# Patient Record
Sex: Male | Born: 1965 | Race: Black or African American | Hispanic: No | Marital: Married | Smoking: Never smoker
Health system: Southern US, Community
[De-identification: ages and names within clinical notes are randomized; demographics above are authoritative.]

---

## 2009-03-27 ENCOUNTER — Encounter: Admission: RE | Admit: 2009-03-27 | Discharge: 2009-03-27 | Payer: Self-pay | Admitting: Chiropractic Medicine

## 2011-07-22 ENCOUNTER — Emergency Department (INDEPENDENT_AMBULATORY_CARE_PROVIDER_SITE_OTHER)
Admission: EM | Admit: 2011-07-22 | Discharge: 2011-07-22 | Disposition: A | Discharge status: Home / Self Care | Payer: BC Managed Care – PPO | Source: Home / Self Care | Attending: Family Medicine | Admitting: Family Medicine

## 2011-07-22 ENCOUNTER — Emergency Department (INDEPENDENT_AMBULATORY_CARE_PROVIDER_SITE_OTHER): Payer: BC Managed Care – PPO

## 2011-07-22 ENCOUNTER — Encounter (HOSPITAL_COMMUNITY): Payer: Self-pay | Admitting: Emergency Medicine

## 2011-07-22 DIAGNOSIS — J029 Acute pharyngitis, unspecified: Secondary | ICD-10-CM

## 2011-07-22 MED ORDER — PREDNISONE (PAK) 10 MG PO TABS: ORAL_TABLET | ORAL | Status: AC

## 2011-07-22 MED ORDER — AZITHROMYCIN 250 MG PO TABS: 250.0000 mg | ORAL_TABLET | Freq: Every day | ORAL | Status: AC

## 2011-07-22 MED ORDER — HYDROCOD POLST-CHLORPHEN POLST 10-8 MG/5ML PO LQCR: 5.0000 mL | Freq: Two times a day (BID) | ORAL | Status: DC | PRN

## 2011-07-22 NOTE — Discharge Instructions (Signed)
Take steroids as directed. Use cough syrup as needed and as directed. If no improvement in symptoms over the next 48 to 72 hours, then fill the antibiotic prescription and complete full course. I recommend aggressive fever control with acetaminophen (Tylenol) and/or ibuprofen. You may use these together, alternating them every 4 hours, or individually, every 8 hours. For example, take acetaminophen 500 to 1000 mg at 12 noon, then 600 to 800 mg of ibuprofen at 4 pm, then acetaminophen at 8 pm, etc. Also, stay hydrated with clear liquids. Return to care should your symptoms not improve, or worsen in any way.

## 2011-07-22 NOTE — ED Notes (Signed)
Vomited twice Saturday, no vomiting since then and no diarrhea

## 2011-07-22 NOTE — ED Provider Notes (Signed)
History     CSN: [id number]  Arrival date & time 07/22/11  1438   First MD Initiated Contact with Patient 07/22/11 1502      Chief Complaint  Patient presents with  . Cough    (Consider location/radiation/quality/duration/timing/severity/associated sxs/prior treatment) HPI Comments: Brandon Wilkins presents for evaluation of persistent cough and sore throat since Saturday. He reports, that he was cleaning his bathroom, but did not having commercial products, available. He reports mixing. Several different chemicals together, to make his own. He reports some noxious fumes that he inhaled. Since that time. He has had persistent cough, and sore throat. He also reports chills.  Patient is a 46 y.o. male presenting with pharyngitis. The history is provided by the patient.  Sore Throat This is a new problem. The current episode started more than 2 days ago. The problem occurs constantly. The problem has not changed since onset.The symptoms are aggravated by swallowing, coughing and drinking. The symptoms are relieved by nothing. He has tried nothing for the symptoms.    History reviewed. No pertinent past medical history.  History reviewed. No pertinent past surgical history.  No family history on file.  History  Substance Use Topics  . Smoking status: Never Smoker   . Smokeless tobacco: Not on file  . Alcohol Use: Yes      Review of Systems  Constitutional: Positive for fever and chills.  HENT: Positive for sore throat. Negative for trouble swallowing.   Eyes: Negative.   Respiratory: Positive for cough.   Cardiovascular: Negative.   Gastrointestinal: Negative.   Genitourinary: Negative.   Musculoskeletal: Negative.   Skin: Negative.   Neurological: Negative.     Allergies  Review of patient's allergies indicates no known allergies.  Home Medications   Current Outpatient Rx  Name Route Sig Dispense Refill  . ALKA-SELTZER PLUS COLD PO Oral Take by mouth.    . NYQUIL PO Oral  Take by mouth.    . AZITHROMYCIN 250 MG PO TABS Oral Take 1 tablet (250 mg total) by mouth daily. Take two tablets on first day, then one tablet each day for four days 6 tablet 0  . HYDROCOD POLST-CPM POLST ER 10-8 MG/5ML PO LQCR Oral Take 5 mLs by mouth every 12 (twelve) hours as needed. 140 mL 0  . PREDNISONE (PAK) 10 MG PO TABS  Take 6 tablets on day 1, 5 tablets on day 2, 4 tablets on day 3, 3 tablets on day 4, 2 tablets on day 5, 1 tablet on day 6 21 tablet 0    BP 150/91  Pulse 94  Temp(Src) 100.8 F (38.2 C) (Oral)  Resp 20  SpO2 96%  Physical Exam  Nursing note and vitals reviewed. Constitutional: He is oriented to person, place, and time. He appears well-developed and well-nourished.  HENT:  Head: Normocephalic and atraumatic.  Right Ear: Tympanic membrane normal.  Left Ear: Tympanic membrane normal.  Mouth/Throat: Uvula is midline, oropharynx is clear and moist and mucous membranes are normal.  Eyes: EOM are normal.  Neck: Normal range of motion.  Cardiovascular: Normal rate, regular rhythm, S1 normal, S2 normal and normal heart sounds.   No murmur heard. Pulmonary/Chest: Effort normal. He has no decreased breath sounds. He has wheezes in the right middle field and the right lower field. He has no rhonchi.  Musculoskeletal: Normal range of motion.  Neurological: He is alert and oriented to person, place, and time.  Skin: Skin is warm and dry.  Psychiatric: His behavior is  normal.    ED Course  Procedures (including critical care time)  Labs Reviewed - No data to display Dg Chest 2 View  07/22/2011  *RADIOLOGY REPORT*  Clinical Data: Cough and fever.  CHEST - 2 VIEW  Comparison: None  Findings: The cardiac silhouette, mediastinal and hilar contours are within normal limits.  The lungs are clear.  No pleural effusion.  The bony thorax is intact.  IMPRESSION: No acute cardiopulmonary findings  Original Report Authenticated By: P. Loralie Champagne, M.D.     1. Pharyngitis        MDM  Xray reviewed by radiologist and myself; no pneumonia; rx given for prednisone, Tussionex, and azithromycin        Renaee Munda, MD 07/22/11 1819

## 2011-07-22 NOTE — ED Notes (Signed)
Reports cough and chills, onset Saturday evening.  Patient that cough was related to fumes from cleaning chemicals.  C/o sore throat.  Reports yellowish/white phlegm.

## 2018-05-27 ENCOUNTER — Ambulatory Visit
Admission: RE | Admit: 2018-05-27 | Discharge: 2018-05-27 | Disposition: A | Discharge status: Home / Self Care | Payer: BC Managed Care – PPO | Source: Ambulatory Visit | Attending: Internal Medicine | Admitting: Internal Medicine

## 2018-05-27 ENCOUNTER — Other Ambulatory Visit: Payer: Self-pay | Admitting: Internal Medicine

## 2018-05-27 DIAGNOSIS — R109 Unspecified abdominal pain: Secondary | ICD-10-CM

## 2020-06-13 ENCOUNTER — Ambulatory Visit: Payer: Self-pay | Attending: Internal Medicine

## 2020-06-13 ENCOUNTER — Other Ambulatory Visit: Payer: Self-pay

## 2020-06-13 DIAGNOSIS — Z23 Encounter for immunization: Secondary | ICD-10-CM

## 2020-06-13 NOTE — Progress Notes (Signed)
   Covid-19 Vaccination Clinic  Name:  Brandon Wilkins    MRN: [id number] DOB: Nov 09, 1965  06/13/2020  [name] was observed post Covid-19 immunization for 15 minutes without incident. He was provided with Vaccine Information Sheet and instruction to access the V-Safe system.   [name] was instructed to call 911 with any severe reactions post vaccine: Marland Kitchen Difficulty breathing  . Swelling of face and throat  . A fast heartbeat  . A bad rash all over body  . Dizziness and weakness   Immunizations Administered    Name Date Dose VIS Date Route   PFIZER Comrnaty(Gray TOP) Covid-19 Vaccine 06/13/2020  2:42 PM 0.3 mL 03/29/2020 Intramuscular   Manufacturer: [employer], [employer]   Lot: [id number]   NDC: [phone]

## 2020-06-20 ENCOUNTER — Other Ambulatory Visit: Payer: Self-pay

## 2020-06-22 ENCOUNTER — Other Ambulatory Visit: Payer: BC Managed Care – PPO

## 2020-08-04 IMAGING — CR DG ABDOMEN 2V
2 series · 2 of 2 positions shown · non-contrast
Comparison: None.

CLINICAL DATA: Generalized abdominal pain for 3 weeks.

EXAM:
ABDOMEN - 2 VIEW

[w abdomen upright *]
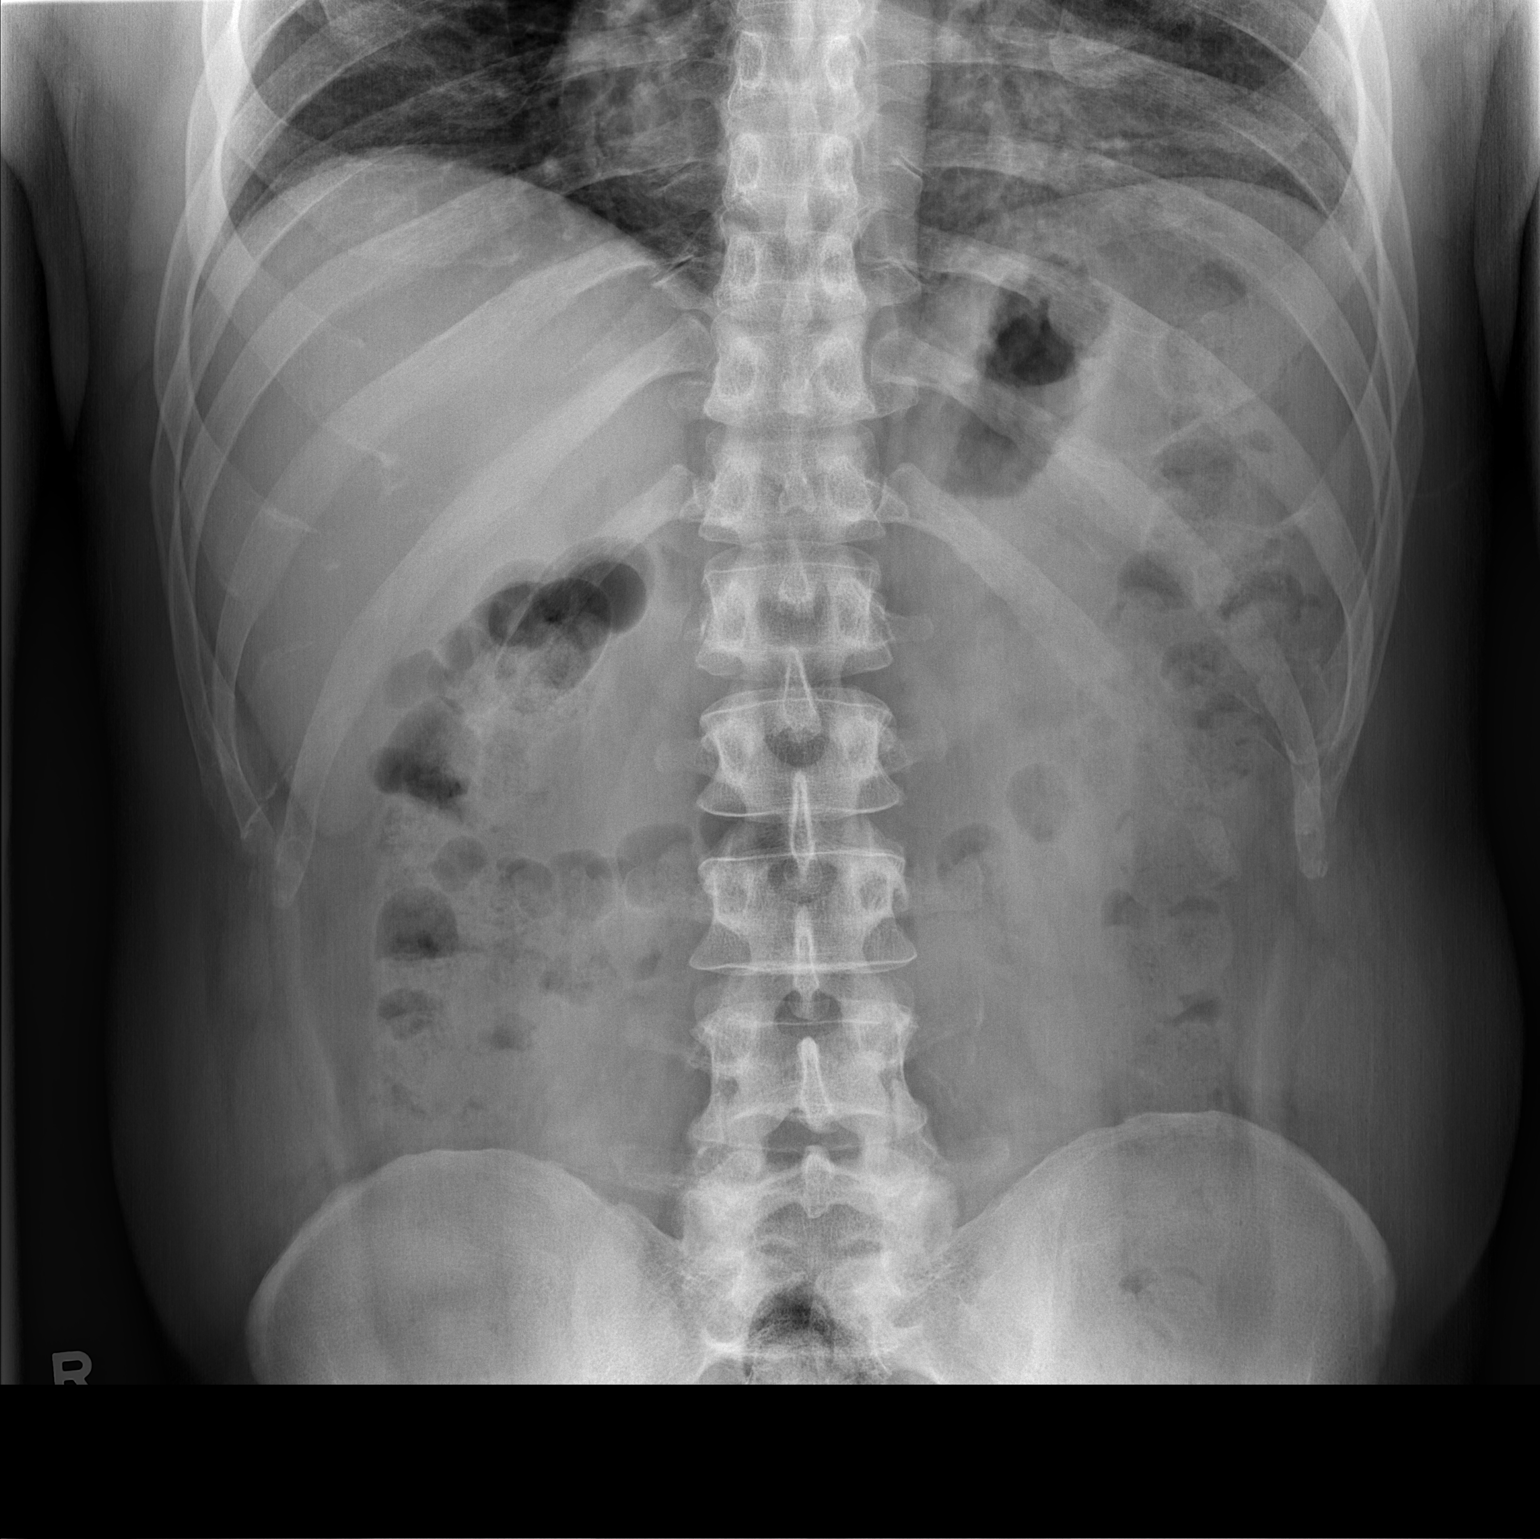

[t abdomen supine]
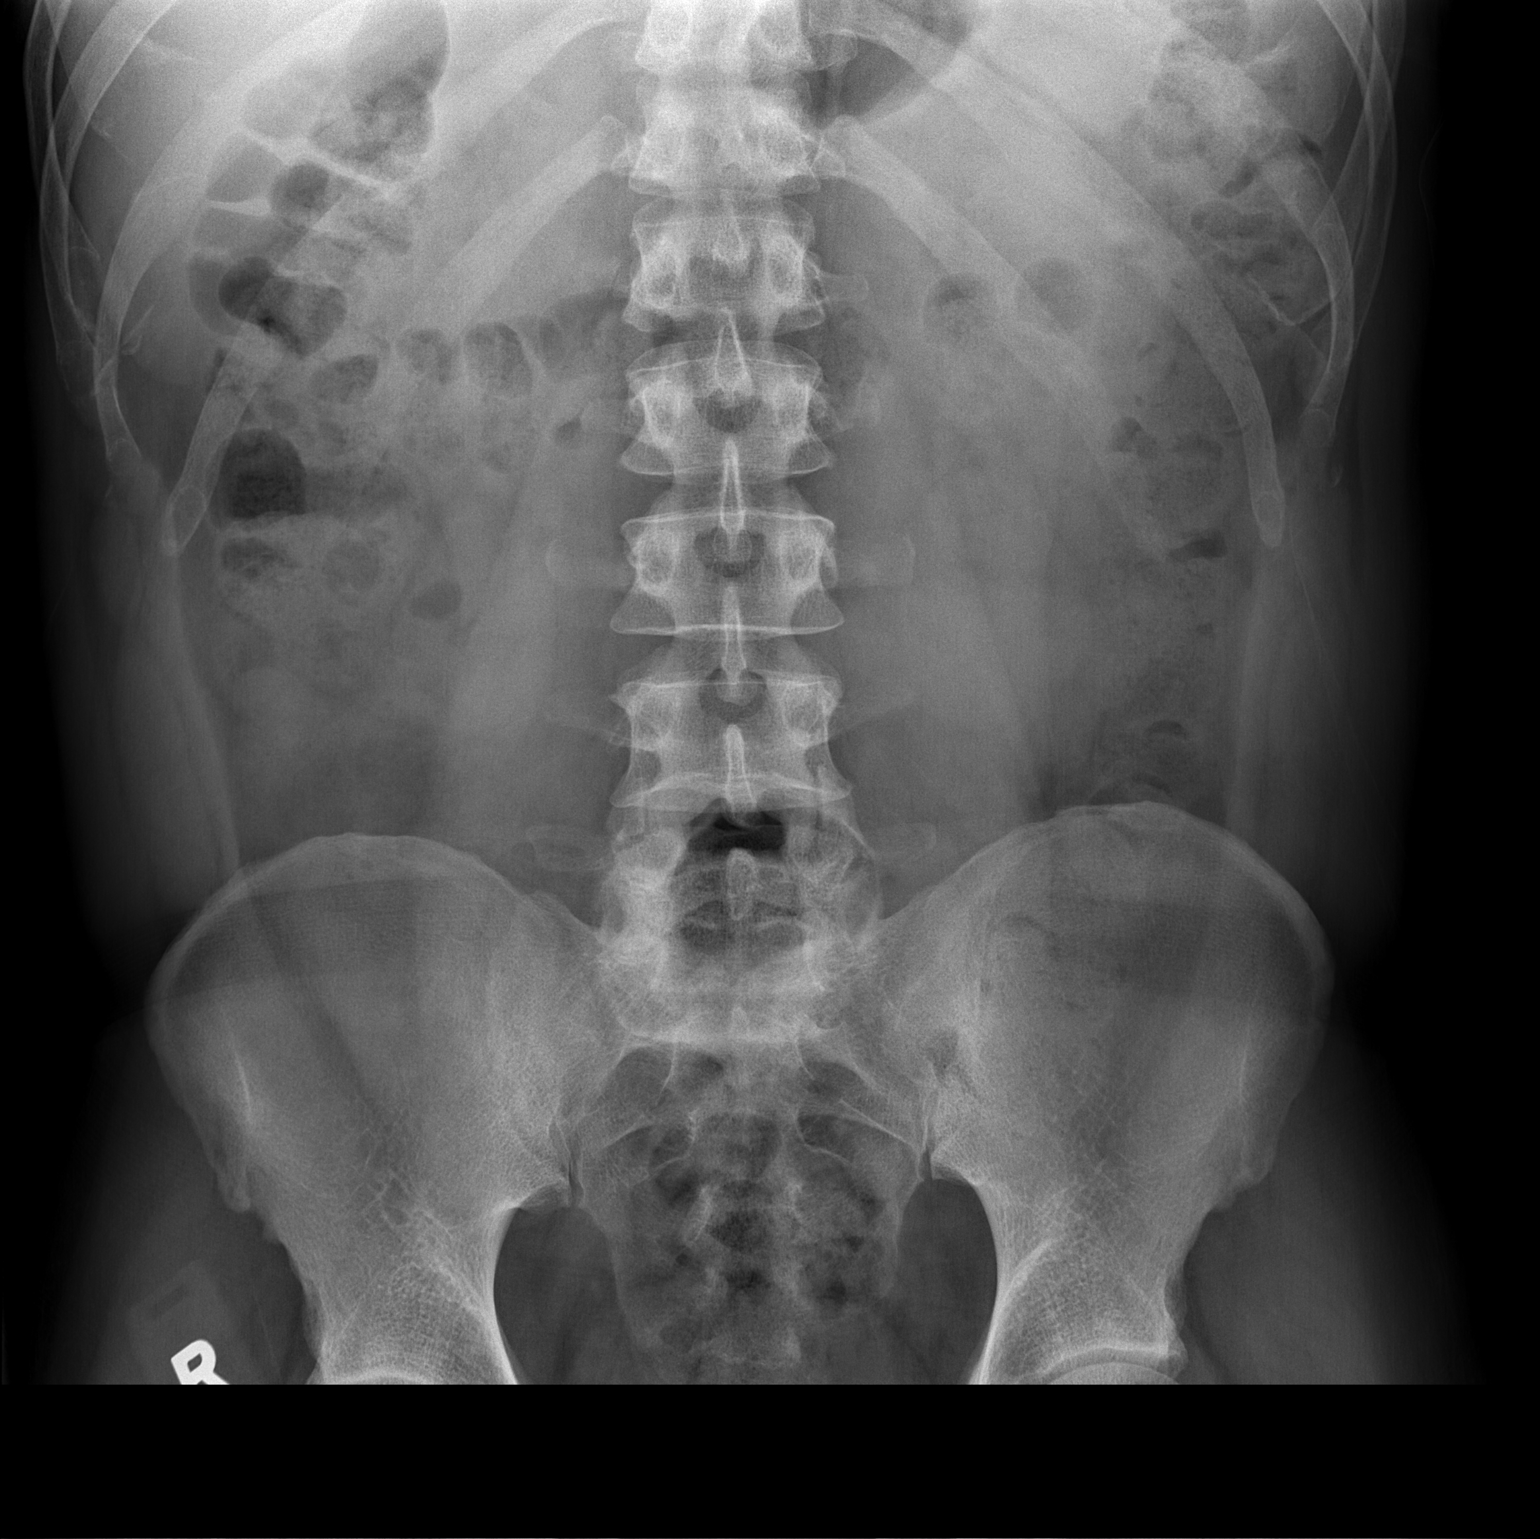

[2 of 2 positions shown; findings below may reference images not displayed]

FINDINGS: Bowel gas pattern is nonobstructive. No evidence of soft tissue mass
or abnormal fluid collection. No evidence of free intraperitoneal
air. No evidence of renal or ureteral calculi. Visualized osseous
structures are unremarkable.
IMPRESSION: Negative.

## 2022-10-08 ENCOUNTER — Ambulatory Visit (INDEPENDENT_AMBULATORY_CARE_PROVIDER_SITE_OTHER): Payer: BC Managed Care – PPO

## 2022-10-08 ENCOUNTER — Ambulatory Visit
Admission: EM | Admit: 2022-10-08 | Discharge: 2022-10-08 | Disposition: A | Discharge status: Home / Self Care | Payer: BC Managed Care – PPO | Attending: Internal Medicine | Admitting: Internal Medicine

## 2022-10-08 DIAGNOSIS — S92424B Nondisplaced fracture of distal phalanx of right great toe, initial encounter for open fracture: Secondary | ICD-10-CM

## 2022-10-08 DIAGNOSIS — Z23 Encounter for immunization: Secondary | ICD-10-CM | POA: Diagnosis not present

## 2022-10-08 DIAGNOSIS — S91209A Unspecified open wound of unspecified toe(s) with damage to nail, initial encounter: Secondary | ICD-10-CM

## 2022-10-08 MED ORDER — CEPHALEXIN 500 MG PO CAPS: 500.0000 mg | ORAL_CAPSULE | Freq: Four times a day (QID) | ORAL | 0 refills | Status: DC

## 2022-10-08 MED ORDER — TETANUS-DIPHTH-ACELL PERTUSSIS 5-2.5-18.5 LF-MCG/0.5 IM SUSY
0.5000 mL | PREFILLED_SYRINGE | Freq: Once | INTRAMUSCULAR | Status: AC
  Administered 2022-10-08: 0.5 mL via INTRAMUSCULAR

## 2022-10-08 NOTE — ED Triage Notes (Signed)
Patient was outside kicking a ball and patient kicked a retaining wall instead of the ball. Injury to right big toe. Bleeding controlled.

## 2022-10-08 NOTE — ED Provider Notes (Addendum)
[facility]    CSN: [id number] Arrival date & time: 10/08/22  0827      History   Chief Complaint Chief Complaint  Patient presents with   Toe Injury    HPI Brandon Wilkins is a 57 y.o. male.   Patient presents with right great toe injury that occurred yesterday around 5 PM.  Patient reports that he attempted to kick a ball but instead kicked the wall.  He states that he is not really having much pain in the toe but is concerned given that the toe nailbed is bleeding.  He states that he does have a fungal infection of the toe that he has been treating so the toenail was already slightly coming off but it seems to be even more so since injury.  He does not take any blood thinning medications.  He is not sure when his last tetanus vaccine was.  Denies numbness or tingling.     History reviewed. No pertinent past medical history.  There are no problems to display for this patient.   History reviewed. No pertinent surgical history.     Home Medications    Prior to Admission medications   Medication Sig Start Date End Date Taking? Authorizing Provider  cephALEXin (KEFLEX) 500 MG capsule Take 1 capsule (500 mg total) by mouth 4 (four) times daily. 10/08/22  Yes Gustavus Bryant, FNP    Family History History reviewed. No pertinent family history.  Social History Social History   Tobacco Use   Smoking status: Never  Substance Use Topics   Alcohol use: Yes   Drug use: No     Allergies   Patient has no known allergies.   Review of Systems Review of Systems Per HPI  Physical Exam Triage Vital Signs ED Triage Vitals [10/08/22 0859]  Enc Vitals Group     BP (!) 146/83     Pulse Rate 72     Resp 18     Temp 97.9 F (36.6 C)     Temp Source Oral     SpO2 98 %     Weight      Height      Head Circumference      Peak Flow      Pain Score 0     Pain Loc      Pain Edu?      Excl. in GC?    No data found.  Updated Vital Signs BP (!) 146/83  (BP Location: Left Arm)   Pulse 72   Temp 97.9 F (36.6 C) (Oral)   Resp 18   SpO2 98%   Visual Acuity Right Eye Distance:   Left Eye Distance:   Bilateral Distance:    Right Eye Near:   Left Eye Near:    Bilateral Near:     Physical Exam Constitutional:      General: He is not in acute distress.    Appearance: Normal appearance. He is not toxic-appearing or diaphoretic.  HENT:     Head: Normocephalic and atraumatic.  Eyes:     Extraocular Movements: Extraocular movements intact.     Conjunctiva/sclera: Conjunctivae normal.  Pulmonary:     Effort: Pulmonary effort is normal.  Feet:     Comments: Patient has moderate swelling noted to the nailbed of the right great toe that is visible.  Only able to visualize partial amount of the nailbed. No obvious laceration noted. Nail appears to be intact mostly to the nailbed but is  slightly avulsed.  Bleeding controlled.  Capillary refill and pulses intact.  Patient can wiggle toe.  There are very small superficial abrasions that are approximately 1 cm to dorsal surface of foot at right second toe and right fourth toe.  No bleeding noted to these areas.  No tenderness to palpation to any of the toes or foot. Neurological:     General: No focal deficit present.     Mental Status: He is alert and oriented to person, place, and time. Mental status is at baseline.  Psychiatric:        Mood and Affect: Mood normal.        Behavior: Behavior normal.        Thought Content: Thought content normal.        Judgment: Judgment normal.      UC Treatments / Results  Labs (all labs ordered are listed, but only abnormal results are displayed) Labs Reviewed - No data to display  EKG   Radiology DG Toe Great Right  Result Date: 10/08/2022 CLINICAL DATA:  Status post injury after kicking retaining wall EXAM: RIGHT GREAT TOE COMPARISON:  None Available. FINDINGS: There is an acute, possibly comminuted, obliquely oriented fracture deformity  extending through the tuft of the distal phalanx. The fracture fragments are in near anatomic alignment. Suspect avulsion of the toenail which appears slightly dorsally displaced. Cannot exclude open fracture. Diffuse soft tissue edema. Tiny radiodensities within the soft tissues overlying the tuft of the distal phalanx may represent small foreign bodies. IMPRESSION: 1. Acute fracture deformity involves the tuft of the distal phalanx. 2. Suspect avulsion of the toenail which appears slightly dorsally displaced. Cannot exclude open fracture. 3. Tiny radiodensities within the soft tissues overlying the tuft of the distal phalanx may represent small foreign bodies. Electronically Signed   By: Signa Kell M.D.   On: 10/08/2022 09:46    Procedures Procedures (including critical care time)  Medications Ordered in UC Medications  Tdap (BOOSTRIX) injection 0.5 mL (0.5 mLs Intramuscular Given 10/08/22 1028)    Initial Impression / Assessment and Plan / UC Course  I have reviewed the triage vital signs and the nursing notes.  Pertinent labs & imaging results that were available during my care of the patient were reviewed by me and considered in my medical decision making (see chart for details).     Wounds were cleaned and pressure dressing applied to help stop bleeding of right great toe.  Bleeding stopped prior to discharge.  Patient has a partial toenail avulsion with lots of inflammation to the nailbed but no obvious laceration noted.  Unsure of patient's baseline as he reports that toenail was already partially avulsed due to fungal infection.  Patient does not wish to proceed today with any toenail removal or intervention for this.  X-ray of the toe showed a tuft fracture of the right great toe.  Complete foot x-ray was not completed given patient does not have any pain to palpation elsewhere.  Discussed this with the patient as this is now most likely considered an open fracture. Originally, called  podiatry and made an appointment for patient to be seen by podiatry on Monday, June 24 at the [facility].  Once x-ray resulted, called on-call podiatrist Dr. Ardelle Anton to discuss x-ray results, who advised that he would like to see him sooner.  He advised that someone from his office will call him in the next day or 2 to schedule an appointment to be  seen this week.  Dr. Ardelle Anton advised postop shoe, cephalexin antibiotic 4 times daily, and Xeroform dressing over the toenail.  Dressing and postop shoe were applied by clinical staff prior to discharge.  Advised patient of daily dressing changes and as needed.  Patient will follow-up with podiatry as soon as possible.  Patient denies any pain. Tetanus vaccine updated today.  He was also given strict ER precautions.  Patient verbalized understanding and was agreeable with plan. Final Clinical Impressions(s) / UC Diagnoses   Final diagnoses:  Open nondisplaced fracture of distal phalanx of right great toe, initial encounter  Avulsion of toenail, initial encounter     Discharge Instructions      You have broken your toe.  You were placed in a boot today to help with support and stability for this.  You also have lots of inflammation and toenail inflammation.  Therefore, you will need to see podiatry.  They should reach out to you in the next 24 to 48 hours but if they do not, please call them yourself at provided contact information.  Technically, you have an appointment at [facility] in [place] on Monday but podiatrist wants to see you sooner.  If bleeding becomes persistent or worsening, please go straight to the emergency department.  Please change dressing daily and as needed if it becomes soiled.     ED Prescriptions     Medication Sig Dispense Auth. Provider   cephALEXin (KEFLEX) 500 MG capsule Take 1 capsule (500 mg total) by mouth 4 (four) times daily. 28 capsule [place]ors, Acie Fredrickson, [place]      PDMP not  reviewed this encounter.   Gustavus Bryant, [place] 10/08/22 1034    Gustavus Bryant, [place] 10/08/22 1035

## 2022-10-08 NOTE — Discharge Instructions (Signed)
You have broken your toe.  You were placed in a boot today to help with support and stability for this.  You also have lots of inflammation and toenail inflammation.  Therefore, you will need to see podiatry.  They should reach out to you in the next 24 to 48 hours but if they do not, please call them yourself at provided contact information.  Technically, you have an appointment at [facility] in [place] on Monday but podiatrist wants to see you sooner.  If bleeding becomes persistent or worsening, please go straight to the emergency department.  Please change dressing daily and as needed if it becomes soiled.

## 2022-10-08 NOTE — ED Notes (Signed)
Pt was notified that his right great toe was broken, went over aftercare instructions provided by Ervin Knack NP, placed xeroform on the toe covered with clan sterile gauze, and co-band, pt was given extra Items care until he was able to get in with Podiatry.   Pt and pt's wife both verbalized understanding of care for the injured great toe.

## 2022-10-09 ENCOUNTER — Encounter: Payer: Self-pay | Admitting: Podiatry

## 2022-10-09 ENCOUNTER — Ambulatory Visit: Payer: BC Managed Care – PPO | Admitting: Podiatry

## 2022-10-09 DIAGNOSIS — B351 Tinea unguium: Secondary | ICD-10-CM | POA: Diagnosis not present

## 2022-10-09 DIAGNOSIS — S91209A Unspecified open wound of unspecified toe(s) with damage to nail, initial encounter: Secondary | ICD-10-CM | POA: Diagnosis not present

## 2022-10-09 DIAGNOSIS — S92401A Displaced unspecified fracture of right great toe, initial encounter for closed fracture: Secondary | ICD-10-CM

## 2022-10-09 NOTE — Patient Instructions (Signed)

## 2022-10-11 NOTE — Progress Notes (Signed)
Subjective:   Patient ID: Brandon Wilkins, male   DOB: 57 y.o.   MRN: [id number]   HPI Chief Complaint  Patient presents with   Nail Problem    Right great toe nail damage  Pt stated that he went to urgent care yesterday and had xray's was told his toe was fractured    57 year old male presents the office for above concerns.  He states that he had a injury on Tuesday to the toe and went to urgent care yesterday.  Was given tetanus and was placed on oral antibiotics.  He presents today for evaluation given the fracture to the toe.  He has been in surgical shoe.  Does not report any fevers or chills.    He has had issues with the toenail, fungus for quite some time he is tried medication over-the-counter for this.  No other concerns.   Review of Systems  All other systems reviewed and are negative.  History reviewed. No pertinent past medical history.  History reviewed. No pertinent surgical history.   Current Outpatient Medications:    sildenafil (REVATIO) 20 MG tablet, Take 2-5 tablets 1 hour prior to sex as needed., Disp: , Rfl:    tadalafil (CIALIS) 20 MG tablet, TAKE 1 Tablet BY MOUTH DAILY AS NEEDED FOR ERECTILE DYSFUNCTION, Disp: , Rfl:    cephALEXin (KEFLEX) 500 MG capsule, Take 1 capsule (500 mg total) by mouth 4 (four) times daily., Disp: 28 capsule, Rfl: 0  No Known Allergies         Objective:  Physical Exam  General: AAO x3, NAD  Dermatological: The right big toe is loose from the nailbed and there is edema around the nail.  There is some granulation tissue present underneath the toenail from what I can see today.  There is no purulence.  See procedure note below.  Vascular: Dorsalis Pedis artery and Posterior Tibial artery pedal pulses are 2/4 bilateral with immedate capillary fill time.  There is no pain with calf compression, swelling, warmth, erythema.   Neruologic: Grossly intact via light touch bilateral.   Musculoskeletal: No pain on exam  Gait:  Unassisted, Nonantalgic.       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 -Given the loosening of the nail as well as the fracture I recommended nail removal.  We discussed the procedure as well as postoperative course.  Alternatives, risks, complications were discussed.  Consent was signed.  I cleaned the skin with alcohol and 3 cc of lidocaine, Marcaine plain was infiltrated in a digital block fashion.  Once anesthetized the toe was prepped with Betadine.  Tourniquet was applied.  I was able to easily remove the toenail without any complications however upon removal there was 1 small area of exposed bone but there was no tissue that I can use to suture over top of this.  I did copiously irrigate with alcohol.  Hemostasis achieved.  Silvadene was applied followed by dressing.  Recommend daily dressing changes.  Monitor for any signs or symptoms of infection. -Continue antibiotics. -He had a history of nail fungus and has had treatment.  I sent the nail for culture. -Remain in surgical shoe  Return in about 1 week (around 10/16/2022) for nail check, x-ray.  Vivi Barrack DPM

## 2022-10-13 ENCOUNTER — Ambulatory Visit: Payer: Self-pay | Admitting: Podiatry

## 2022-10-16 ENCOUNTER — Ambulatory Visit (INDEPENDENT_AMBULATORY_CARE_PROVIDER_SITE_OTHER): Payer: BC Managed Care – PPO | Admitting: Podiatry

## 2022-10-16 ENCOUNTER — Ambulatory Visit (INDEPENDENT_AMBULATORY_CARE_PROVIDER_SITE_OTHER): Payer: BC Managed Care – PPO

## 2022-10-16 DIAGNOSIS — S99921D Unspecified injury of right foot, subsequent encounter: Secondary | ICD-10-CM

## 2022-10-16 DIAGNOSIS — S92401D Displaced unspecified fracture of right great toe, subsequent encounter for fracture with routine healing: Secondary | ICD-10-CM

## 2022-10-16 MED ORDER — CEPHALEXIN 500 MG PO CAPS: 500.0000 mg | ORAL_CAPSULE | Freq: Three times a day (TID) | ORAL | 0 refills | Status: DC

## 2022-10-20 ENCOUNTER — Telehealth: Payer: Self-pay | Admitting: Podiatry

## 2022-10-20 NOTE — Telephone Encounter (Signed)
Pts wie left message today stating they seen Dr Ardelle Anton last week and he said he wanted pt to be on antibiotics for another week and pt is having som swelling. The pharmacy did not have a rx.  Upon checking the chart it looks like keflex was sent in on 6.27.   I notified wife and told her to please let us know if the pharmacy did not get the rx. She said thank you.

## 2022-10-26 NOTE — Progress Notes (Signed)
Subjective: No chief complaint on file.  57 year old male presents the office today for follow evaluation of toe injury on the right side.  He states the wound has been healing well since having the nail removed.  Has not had any pain.  Still wearing surgical shoe.  Denies any fevers or chills.  Objective: AAO x3, NAD DP/PT pulses palpable bilaterally, CRT less than 3 seconds Status post hallux nail removal.  There is starting to form a scab on the periphery of the wound.  The central aspect there is still a superficial granular wound.  Not able to probe any bone today and all the bone is covered.  Minimal edema.  There is no significant erythema, cellulitis.  There is no fluctuation, crepitation, malodor.  No pain. No pain with calf compression, swelling, warmth, erythema  Assessment: Left hallux fracture status post nail removal  Plan: -All treatment options discussed with the patient including all alternatives, risks, complications.  -X-rays obtained and reviewed.  3 views of the foot were obtained.  Fracture noted along the distal tuft of the distal phalanx of the hallux. -Wound appears to be healing well.  Discussed Epsom salt soaks, antibiotic ointment and a bandage.-Area continues to heal once he can start to leave it open at nighttime.  Asked to remain in surgical shoe for another week and then if he is doing well and once the wound is completely healed that he can gradually start to transition to regular shoe.  Should there be any increased pain, swelling or any signs of infection he needs to remain in the surgical shoe. -Monitor for any clinical signs or symptoms of infection and directed to call the office immediately should any occur or go to the ER.  We did discuss that if the wound does not heal, or if the wound gets infected, it does put him at risk of amputation of the toe.  -Patient encouraged to call the office with any questions, concerns, change in symptoms.   Vivi Barrack DPM

## 2022-10-29 ENCOUNTER — Encounter: Payer: Self-pay | Admitting: Podiatry

## 2022-11-06 ENCOUNTER — Ambulatory Visit: Payer: BC Managed Care – PPO

## 2022-11-06 ENCOUNTER — Ambulatory Visit: Payer: BC Managed Care – PPO | Admitting: Podiatry

## 2022-11-06 DIAGNOSIS — S92401D Displaced unspecified fracture of right great toe, subsequent encounter for fracture with routine healing: Secondary | ICD-10-CM | POA: Diagnosis not present

## 2022-11-06 DIAGNOSIS — B351 Tinea unguium: Secondary | ICD-10-CM

## 2022-11-06 NOTE — Progress Notes (Unsigned)
Subjective: Chief Complaint  Patient presents with   Fracture    Patient came in today for right foot fracture, follow-up,     57 year old male presents the office today for follow evaluation of toe injury on the right side.  States he has been doing well but he started playing golf again and noticed a small amount of bleeding on the bandage.  He does not have any significant pain.  Denies any fevers or chills.  Is only taking antibiotics 1 time a day and not as prescribed.  No fevers or chills.  Objective: AAO x3, NAD DP/PT pulses palpable bilaterally, CRT less than 3 seconds Status post hallux nail removal.  Overall the wound is healing but there is still some cannulation tissue present on the distal portion of the nailbed there is no exposed bone.  There is still some mild edema but there is no erythema, drainage or pus or ascending cellulitis.  No fluctuation, crepitation, malodor. No pain with calf compression, swelling, warmth, erythema  Assessment: Left hallux fracture status post nail removal  Plan: -All treatment options discussed with the patient including all alternatives, risks, complications.  -X-rays obtained and reviewed.  3 views of the foot were obtained.  Fracture noted along the distal tuft of the distal phalanx of the hallux.  There is increased consolidation noted.   -We discussed long-term management for surgery bony fragment and remove the bony fragment should cause any issues.  I recommend holding golf or activity for now until the wound bed completely heals.  Discussed salt soaks, and silicone dressing changes during the day.  Monitor closely for any signs or symptoms of infection.  Discussed taking antibiotics as prescribed and we did discuss that should this be infected there is a chance of toe amputation should this not heal or become infected.  -Monitor for any clinical signs or symptoms of infection and directed to call the office immediately should any occur or go  to the ER.  Return in about 2 weeks (around 11/20/2022). X-ray next appointment   Vivi Barrack DPM

## 2022-11-06 NOTE — Patient Instructions (Signed)

## 2022-11-11 ENCOUNTER — Other Ambulatory Visit: Payer: Self-pay | Admitting: Podiatry

## 2023-01-12 ENCOUNTER — Encounter: Payer: Self-pay | Admitting: Podiatry

## 2023-01-22 ENCOUNTER — Other Ambulatory Visit: Payer: Self-pay | Admitting: Internal Medicine

## 2023-01-22 ENCOUNTER — Ambulatory Visit
Admission: RE | Admit: 2023-01-22 | Discharge: 2023-01-22 | Disposition: A | Discharge status: Home / Self Care | Payer: BC Managed Care – PPO | Source: Ambulatory Visit | Attending: Internal Medicine | Admitting: Internal Medicine

## 2023-01-22 DIAGNOSIS — R1084 Generalized abdominal pain: Secondary | ICD-10-CM

## 2024-05-05 ENCOUNTER — Other Ambulatory Visit: Payer: Self-pay | Admitting: Internal Medicine

## 2024-05-05 DIAGNOSIS — R1310 Dysphagia, unspecified: Secondary | ICD-10-CM

## 2024-06-16 ENCOUNTER — Other Ambulatory Visit: Payer: Self-pay
